# Patient Record
Sex: Male | Born: 1990 | Race: Black or African American | Hispanic: No | Marital: Single | Smoking: Former smoker
Health system: Southern US, Community
[De-identification: ages and names within clinical notes are randomized; demographics above are authoritative.]

## PROBLEM LIST (undated history)

## (undated) HISTORY — PX: FRACTURE SURGERY: SHX138

---

## 2017-11-23 ENCOUNTER — Emergency Department (HOSPITAL_COMMUNITY)
Admission: EM | Admit: 2017-11-23 | Discharge: 2017-11-23 | Disposition: A | Discharge status: Home / Self Care | Payer: Self-pay | Attending: Emergency Medicine | Admitting: Emergency Medicine

## 2017-11-23 ENCOUNTER — Encounter (HOSPITAL_COMMUNITY): Payer: Self-pay

## 2017-11-23 ENCOUNTER — Emergency Department (HOSPITAL_COMMUNITY): Payer: Self-pay

## 2017-11-23 ENCOUNTER — Ambulatory Visit (HOSPITAL_COMMUNITY)
Admission: EM | Admit: 2017-11-23 | Discharge: 2017-11-23 | Disposition: A | Discharge status: Home / Self Care | Payer: Self-pay | Attending: Family Medicine | Admitting: Family Medicine

## 2017-11-23 ENCOUNTER — Other Ambulatory Visit: Payer: Self-pay

## 2017-11-23 ENCOUNTER — Encounter (HOSPITAL_COMMUNITY): Payer: Self-pay | Admitting: Emergency Medicine

## 2017-11-23 ENCOUNTER — Ambulatory Visit (INDEPENDENT_AMBULATORY_CARE_PROVIDER_SITE_OTHER): Payer: Self-pay

## 2017-11-23 DIAGNOSIS — M79604 Pain in right leg: Secondary | ICD-10-CM

## 2017-11-23 DIAGNOSIS — Z5321 Procedure and treatment not carried out due to patient leaving prior to being seen by health care provider: Secondary | ICD-10-CM | POA: Insufficient documentation

## 2017-11-23 DIAGNOSIS — M25571 Pain in right ankle and joints of right foot: Secondary | ICD-10-CM | POA: Insufficient documentation

## 2017-11-23 MED ORDER — IBUPROFEN 800 MG PO TABS: 800.0000 mg | ORAL_TABLET | Freq: Three times a day (TID) | ORAL | 0 refills | Status: AC

## 2017-11-23 MED ORDER — MUPIROCIN 2 % EX OINT: 1.0000 "application " | TOPICAL_OINTMENT | Freq: Two times a day (BID) | CUTANEOUS | 0 refills | Status: AC
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
## 2017-11-23 NOTE — Discharge Instructions (Signed)
No fractures on xrays  Use anti-inflammatories for pain/swelling. You may take up to 800 mg Ibuprofen every 8 hours with food. You may supplement Ibuprofen with Tylenol [phone] mg every 8 hours.   Please apply Bactroban ointment to abrasions on lower leg twice daily-please monitor these areas for gradual healing, please return if developing increased redness, pain, swelling or drainage to these areas  Pain should gradually improve over the next 1 to 2 weeks, please follow-up if symptoms worsening, developing increased swelling in leg or calf, numbness or tingling, weakness
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
## 2018-09-24 DEATH — deceased
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
